# Patient Record
Sex: Female | Born: 1960 | Race: White | Hispanic: No | Marital: Married | Smoking: Current every day smoker
Health system: Southern US, Community
[De-identification: ages and names within clinical notes are randomized; demographics above are authoritative.]

## PROBLEM LIST (undated history)

## (undated) DIAGNOSIS — T7840XA Allergy, unspecified, initial encounter: Secondary | ICD-10-CM

## (undated) HISTORY — DX: Allergy, unspecified, initial encounter: T78.40XA

## (undated) HISTORY — PX: OTHER SURGICAL HISTORY: SHX169

---

## 2005-02-20 ENCOUNTER — Inpatient Hospital Stay: Payer: Self-pay | Admitting: Specialist

## 2006-11-17 ENCOUNTER — Ambulatory Visit: Payer: Self-pay | Admitting: Obstetrics and Gynecology

## 2018-04-26 ENCOUNTER — Other Ambulatory Visit: Payer: Self-pay

## 2018-04-26 ENCOUNTER — Encounter: Payer: Self-pay | Admitting: Nurse Practitioner

## 2018-04-26 ENCOUNTER — Ambulatory Visit: Payer: 59 | Admitting: Nurse Practitioner

## 2018-04-26 VITALS — BP 140/77 | HR 83 | Temp 98.3°F | Resp 16 | Ht 61.0 in | Wt 127.6 lb

## 2018-04-26 DIAGNOSIS — Z7689 Persons encountering health services in other specified circumstances: Secondary | ICD-10-CM | POA: Diagnosis not present

## 2018-04-26 DIAGNOSIS — B373 Candidiasis of vulva and vagina: Secondary | ICD-10-CM

## 2018-04-26 DIAGNOSIS — R300 Dysuria: Secondary | ICD-10-CM | POA: Diagnosis not present

## 2018-04-26 DIAGNOSIS — B3731 Acute candidiasis of vulva and vagina: Secondary | ICD-10-CM

## 2018-04-26 DIAGNOSIS — N3001 Acute cystitis with hematuria: Secondary | ICD-10-CM | POA: Diagnosis not present

## 2018-04-26 LAB — POCT URINALYSIS DIPSTICK
Bilirubin, UA: NEGATIVE
Glucose, UA: NEGATIVE
Ketones, UA: NEGATIVE
Nitrite, UA: NEGATIVE
Protein, UA: NEGATIVE
Spec Grav, UA: 1.01 (ref 1.010–1.025)
Urobilinogen, UA: 0.2 E.U./dL
pH, UA: 5 (ref 5.0–8.0)

## 2018-04-26 MED ORDER — CEPHALEXIN 500 MG PO CAPS: 500.0000 mg | ORAL_CAPSULE | Freq: Three times a day (TID) | ORAL | 0 refills | Status: AC

## 2018-04-26 MED ORDER — FLUCONAZOLE 150 MG PO TABS: 150.0000 mg | ORAL_TABLET | Freq: Once | ORAL | 0 refills | Status: AC

## 2018-04-26 NOTE — Patient Instructions (Addendum)
Lauren Haynes,   Thank you for coming in to clinic today.  1. For seasonal allergies - during allergy season take Claritin or generic loratadine 10 mg one tablet daily. - Can add flonase daily for at least 2 weeks if nasal congestion/sinus pressure worsen.  2.  Recommend starting with a full pack of cigarettes every morning.  Then cut back by 1-2 cigarettes per week/month until you have reduced your smoking.  - Let me know if you are ready to fully quit and want medications or nicotine replacement to help quit.  3. You have a UTI - START Keflex 500mg  3 times daily for next 5 days.   - Sent also diflucan if you get a yeast infection.  Take 1 tablet once. - Make sure you are urinating at least once every 3-4 hours.  Double void to completely empty your bladder if it has been longer than 3-4 hours.  Please schedule a follow-up appointment with Wilhelmina Mcardle, AGNP. Return in about 2 weeks (around 05/10/2018) for annual physical.    If you have any other questions or concerns, please feel free to call the clinic or send a message through MyChart. You may also schedule an earlier appointment if necessary.  You will receive a survey after today's visit either digitally by e-mail or paper by [employer]. Your experiences and feedback matter to [place].  Please respond so we know how we are doing as we provide care for you.   Wilhelmina Mcardle, DNP, AGNP-BC Adult Gerontology Nurse Practitioner [facility], [facility]

## 2018-04-26 NOTE — Progress Notes (Signed)
Subjective:    Patient ID: Lauren Haynes, female    DOB: [birth year], 58 y.o.   MRN: [id number]  Lauren LessenSandra L Jeanbaptiste is a 58 y.o. female presenting on 04/26/2018 for Annual Exam (Establish care) and Urinary Tract Infection (past week , burning, urge to urinate and nothing)   HPI Establish Care New Provider Pt last seen by PCP Many years ago.  [facility]  UTI symptoms   Patient feels she has had symptoms for over 1 week. Symptoms include pelvic pressure, dysuria, urinary urgency, inability to void with urge (not for prolonged periods of time).   - Started taking cranberry juice, taking Azo.  Has pelvic/bladder pain.  Mild improvement with OTC remedies, but pain persists.    - Has had UTI in past, but none in several years.  Patient notes in past she has had yeast infection after antibiotics treatment for UTI. - Patient has not had STD testing since separation/divorce from her husband.  Has occasional rash on buttock. Skin gets really sore/sensitive.  Opens into small blisters.  Do with Physical.  Past Medical History:  Diagnosis Date  . Allergy    Past Surgical History:  Procedure Laterality Date  . left leg broke Left    Social History   Socioeconomic History  . Marital status: Married    Spouse name: Not on file  . Number of children: Not on file  . Years of education: Not on file  . Highest education level: High school graduate  Occupational History  . Not on file  Social Needs  . Financial resource strain: Not hard at all  . Food insecurity:    Worry: Never true    Inability: Never true  . Transportation needs:    Medical: No    Non-medical: No  Tobacco Use  . Smoking status: Current Every Day Smoker    Packs/day: 1.00    Years: 15.00    Pack years: 15.00  . Smokeless tobacco: Never Used  . Tobacco comment: discussed reducing cigarettes available (start w new pack daily, reduce and continue to budget  cigarettes)  Substance and Sexual Activity  . Alcohol use: Yes    Comment: 0-1 per week  . Drug use: Not Currently    Comment: past 30 years ago, use limited to social only (no IVDU)  . Sexual activity: Not Currently    Birth control/protection: None  Lifestyle  . Physical activity:    Days per week: 0 days    Minutes per session: 0 min  . Stress: Not on file  Relationships  . Social connections:    Talks on phone: Not on file    Gets together: Not on file    Attends religious service: Not on file    Active member of club or organization: Not on file    Attends meetings of clubs or organizations: Not on file    Relationship status: Not on file  . Intimate partner violence:    Fear of current or ex partner: No    Emotionally abused: No    Physically abused: No    Forced sexual activity: No  Other Topics Concern  . Not on file  Social History Narrative  . Not on file   Family History  Problem Relation Age of Onset  . COPD Mother   . Heart disease Father   . Heart attack Maternal Grandfather   . Heart attack Paternal Grandfather    Current  Outpatient Medications on File Prior to Visit  Medication Sig  . acetaminophen (TYLENOL) 325 MG tablet Take 650 mg by mouth every 6 (six) hours as needed.  . Cranberry-Vitamin C-Probiotic (AZO CRANBERRY) 250-30 MG TABS Take by mouth.  Marland Kitchen ibuprofen (ADVIL,MOTRIN) 200 MG tablet Take 200 mg by mouth every 6 (six) hours as needed.   No current facility-administered medications on file prior to visit.     Review of Systems  Constitutional: Negative for activity change, appetite change and fatigue.  HENT: Negative for congestion.   Eyes: Negative for visual disturbance.  Respiratory: Negative for cough and shortness of breath.   Cardiovascular: Negative for chest pain, palpitations and leg swelling.  Gastrointestinal: Negative for constipation, diarrhea, nausea and vomiting.  Endocrine: Negative for cold intolerance and heat intolerance.   Genitourinary: Positive for difficulty urinating, dysuria, frequency, pelvic pain and urgency. Negative for dyspareunia, hematuria, vaginal bleeding, vaginal discharge and vaginal pain.  Musculoskeletal: Negative for arthralgias and myalgias.  Skin: Negative for rash.  Allergic/Immunologic: Negative for food allergies.  Neurological: Negative for dizziness and headaches.  Psychiatric/Behavioral: Negative for dysphoric mood, sleep disturbance and suicidal ideas. The patient is not nervous/anxious.    Per HPI unless specifically indicated above     Objective:    BP 140/77   Pulse 83   Temp 98.3 F (36.8 C) (Oral)   Resp 16   Ht 5\' 1"  (1.549 m)   Wt 127 lb 9.6 oz (57.9 kg)   SpO2 100%   BMI 24.11 kg/m   Wt Readings from Last 3 Encounters:  04/26/18 127 lb 9.6 oz (57.9 kg)    Physical Exam Vitals signs reviewed.  Constitutional:      General: She is not in acute distress.    Appearance: Normal appearance. She is well-developed.  HENT:     Head: Normocephalic and atraumatic.  Cardiovascular:     Rate and Rhythm: Normal rate and regular rhythm.     Pulses:          Radial pulses are 2+ on the right side and 2+ on the left side.       Posterior tibial pulses are 1+ on the right side and 1+ on the left side.     Heart sounds: Normal heart sounds, S1 normal and S2 normal.  Pulmonary:     Effort: Pulmonary effort is normal. No respiratory distress.     Breath sounds: Normal breath sounds and air entry.  Abdominal:     General: Bowel sounds are normal. There is no distension.     Palpations: Abdomen is soft. There is no mass.     Tenderness: There is abdominal tenderness in the suprapubic area. There is left CVA tenderness. There is no right CVA tenderness, guarding or rebound.     Hernia: No hernia is present.  Musculoskeletal:     Right lower leg: No edema.     Left lower leg: No edema.  Skin:    General: Skin is warm and dry.     Capillary Refill: Capillary refill takes  less than 2 seconds.  Neurological:     Mental Status: She is alert and oriented to person, place, and time.  Psychiatric:        Attention and Perception: Attention normal.        Mood and Affect: Mood and affect normal.        Behavior: Behavior normal. Behavior is cooperative.        Thought Content: Thought content normal.  Judgment: Judgment normal.      Results for orders placed or performed in visit on 04/26/18  POCT Urinalysis Dipstick  Result Value Ref Range   Color, UA yellow    Clarity, UA clear    Glucose, UA Negative Negative   Bilirubin, UA negative    Ketones, UA negative    Spec Grav, UA 1.010 1.010 - 1.025   Blood, UA LARGE    pH, UA 5.0 5.0 - 8.0   Protein, UA Negative Negative   Urobilinogen, UA 0.2 0.2 or 1.0 E.U./dL   Nitrite, UA negative    Leukocytes, UA Large (3+) (A) Negative   Appearance     Odor        Assessment & Plan:   Problem List Items Addressed This Visit    None    Visit Diagnoses    Dysuria    -  Primary   Relevant Orders   POCT Urinalysis Dipstick (Completed)   Encounter to establish care       Acute cystitis with hematuria       Vaginal candidiasis          # Previous PCP was many years ago.  Recent care is acute episodic care.  Records will not be requested.  Past medical, family, and surgical history reviewed w/ pt.  # Acute cystitis with hematuria.  Pt symptomatic currently with increased suprapubic pressure x 7+ days. Currently without systemic signs or symptoms of infection.   - No current risk of concurrent STI.  Plan: 1. START Keflex 500mg  3 times daily for next 5 days.   - Patient often with vaginal candidiasis after UTI treatment.  Sent diflucan for treatment after antibiotics if needed. 2. Provided non-pharm measures for UTI prevention for good hygiene. 3. Drink plenty of fluids and improve hydration over next 1 week. 4. Provided precautions for severe symptoms requiring ED visit to include no urine in 24-48  hours. 5. Followup 2-5 days as needed for worsening or persistent symptoms.     Meds ordered this encounter  Medications  . cephALEXin (KEFLEX) 500 MG capsule    Sig: Take 1 capsule (500 mg total) by mouth 3 (three) times daily for 5 days.    Dispense:  15 capsule    Refill:  0    Order Specific Question:   Supervising Provider    Answer:   Smitty Cords [[id number]]  . fluconazole (DIFLUCAN) 150 MG tablet    Sig: Take 1 tablet (150 mg total) by mouth once for 1 dose.    Dispense:  1 tablet    Refill:  0    Order Specific Question:   Supervising Provider    Answer:   Smitty Cords [[id number]]     Follow up plan: Return in about 2 weeks (around 05/10/2018) for annual physical.  Wilhelmina Mcardle, DNP, AGPCNP-BC Adult Gerontology Primary Care Nurse Practitioner [facility] 04/26/2018, 10:58 AM

## 2018-04-27 ENCOUNTER — Encounter: Payer: Self-pay | Admitting: Nurse Practitioner

## 2018-05-03 ENCOUNTER — Encounter: Payer: Self-pay | Admitting: Nurse Practitioner

## 2018-05-10 ENCOUNTER — Encounter: Payer: Self-pay | Admitting: Nurse Practitioner

## 2018-05-10 ENCOUNTER — Other Ambulatory Visit: Payer: Self-pay

## 2018-05-10 ENCOUNTER — Ambulatory Visit (INDEPENDENT_AMBULATORY_CARE_PROVIDER_SITE_OTHER): Payer: 59 | Admitting: Nurse Practitioner

## 2018-05-10 ENCOUNTER — Other Ambulatory Visit (HOSPITAL_COMMUNITY)
Admission: RE | Admit: 2018-05-10 | Discharge: 2018-05-10 | Disposition: A | Discharge status: Home / Self Care | Payer: 59 | Source: Ambulatory Visit | Attending: Nurse Practitioner | Admitting: Nurse Practitioner

## 2018-05-10 VITALS — BP 124/70 | HR 78 | Temp 98.5°F | Ht 61.0 in | Wt 127.6 lb

## 2018-05-10 DIAGNOSIS — Z124 Encounter for screening for malignant neoplasm of cervix: Secondary | ICD-10-CM | POA: Diagnosis present

## 2018-05-10 DIAGNOSIS — Z1239 Encounter for other screening for malignant neoplasm of breast: Secondary | ICD-10-CM

## 2018-05-10 DIAGNOSIS — Z1211 Encounter for screening for malignant neoplasm of colon: Secondary | ICD-10-CM | POA: Diagnosis not present

## 2018-05-10 DIAGNOSIS — Z13820 Encounter for screening for osteoporosis: Secondary | ICD-10-CM | POA: Diagnosis not present

## 2018-05-10 DIAGNOSIS — Z23 Encounter for immunization: Secondary | ICD-10-CM | POA: Diagnosis not present

## 2018-05-10 DIAGNOSIS — E782 Mixed hyperlipidemia: Secondary | ICD-10-CM

## 2018-05-10 DIAGNOSIS — Z0000 Encounter for general adult medical examination without abnormal findings: Secondary | ICD-10-CM | POA: Diagnosis not present

## 2018-05-10 NOTE — Progress Notes (Signed)
Subjective:    Patient ID: Lauren Haynes, female    DOB: 10/19/1960, 58 y.o.   MRN: [id number]  Lauren Haynes is a 58 y.o. female presenting on 05/10/2018 for Annual Exam  HPI Annual Physical Exam Patient has been feeling well.  UTI is resolved.  They have no acute concerns today. Sleeps 8 hours per night uninterrupted.  HEALTH MAINTENANCE: Weight/BMI: stable - per patient at home and in clinic Physical activity: regular Diet: meals out/fried foods regularly.  Limits portions.  10 times per week meals out.  - Roast beef sandwich/cheddar fries - eats only 1/2.   Seatbelt: always Sunscreen: not regularly other than face, if prolonged periods in summer PAP: does not remember last -  Mammogram: no recent screening - has had past cysts DEXA: Has had one more than 6 years ago Colon Cancer Screen: cologuard HIV/HEP C: Recommended and patient consents Optometry: not regularly Dentistry: Regularly  VACCINES: Tetanus: due - receive today Influenza: declines  Past Medical History:  Diagnosis Date  . Allergy    Past Surgical History:  Procedure Laterality Date  . left leg broke Left    Social History   Socioeconomic History  . Marital status: Married    Spouse name: Not on file  . Number of children: Not on file  . Years of education: Not on file  . Highest education level: High school graduate  Occupational History  . Not on file  Social Needs  . Financial resource strain: Not hard at all  . Food insecurity:    Worry: Never true    Inability: Never true  . Transportation needs:    Medical: No    Non-medical: No  Tobacco Use  . Smoking status: Current Every Day Smoker    Packs/day: 1.00    Years: 15.00    Pack years: 15.00  . Smokeless tobacco: Never Used  . Tobacco comment: discussed reducing cigarettes available (start w new pack daily, reduce and continue to budget cigarettes)  Substance and Sexual Activity  . Alcohol use: Yes    Comment: 0-1 per week   . Drug use: Not Currently    Comment: past 30 years ago, use limited to social only (no IVDU)  . Sexual activity: Not Currently    Birth control/protection: None  Lifestyle  . Physical activity:    Days per week: 0 days    Minutes per session: 0 min  . Stress: Not on file  Relationships  . Social connections:    Talks on phone: Not on file    Gets together: Not on file    Attends religious service: Not on file    Active member of club or organization: Not on file    Attends meetings of clubs or organizations: Not on file    Relationship status: Not on file  . Intimate partner violence:    Fear of current or ex partner: No    Emotionally abused: No    Physically abused: No    Forced sexual activity: No  Other Topics Concern  . Not on file  Social History Narrative  . Not on file   Family History  Problem Relation Age of Onset  . COPD Mother   . Heart disease Father   . Heart attack Maternal Grandfather   . Heart attack Paternal Grandfather    No current outpatient medications on file prior to visit.   No current facility-administered medications on file prior to visit.     Review  of Systems  Constitutional: Negative for chills and fever.  HENT: Negative for congestion and sore throat.   Eyes: Negative for pain.  Respiratory: Negative for cough, shortness of breath and wheezing.   Cardiovascular: Negative for chest pain, palpitations and leg swelling.  Gastrointestinal: Negative for abdominal pain, blood in stool, constipation, diarrhea, nausea and vomiting.  Endocrine: Negative for polydipsia.  Genitourinary: Positive for dyspareunia. Negative for dysuria, frequency, hematuria and urgency.  Musculoskeletal: Negative for back pain, myalgias and neck pain.  Skin: Negative.  Negative for rash.  Allergic/Immunologic: Negative for environmental allergies.  Neurological: Negative for dizziness, weakness and headaches.  Hematological: Does not bruise/bleed easily.   Psychiatric/Behavioral: Negative for dysphoric mood and suicidal ideas. The patient is not nervous/anxious.    Per HPI unless specifically indicated above     Objective:    BP 124/70 (BP Location: Right Arm, Patient Position: Sitting, Cuff Size: Normal)   Pulse 78   Temp 98.5 F (36.9 C) (Oral)   Ht 5\' 1"  (1.549 m)   Wt 127 lb 9.6 oz (57.9 kg)   BMI 24.11 kg/m   Wt Readings from Last 3 Encounters:  05/10/18 127 lb 9.6 oz (57.9 kg)  04/26/18 127 lb 9.6 oz (57.9 kg)    Physical Exam Vitals signs and nursing note reviewed.  Constitutional:      General: She is not in acute distress.    Appearance: She is well-developed.  HENT:     Head: Normocephalic and atraumatic.     Right Ear: External ear normal.     Left Ear: External ear normal.     Nose: Nose normal.  Eyes:     Conjunctiva/sclera: Conjunctivae normal.     Pupils: Pupils are equal, round, and reactive to light.  Neck:     Musculoskeletal: Normal range of motion and neck supple.     Thyroid: No thyromegaly.     Vascular: No JVD.     Trachea: No tracheal deviation.  Cardiovascular:     Rate and Rhythm: Normal rate and regular rhythm.     Heart sounds: Normal heart sounds. No murmur. No friction rub. No gallop.   Pulmonary:     Effort: Pulmonary effort is normal. No respiratory distress.     Breath sounds: Normal breath sounds.  Chest:     Comments: Breast - Normal exam w/ symmetric breasts, no mass, no nipple discharge, no skin changes or tenderness. Abdominal:     General: Bowel sounds are normal. There is no distension.     Palpations: Abdomen is soft.     Tenderness: There is no abdominal tenderness.  Genitourinary:    Comments: Normal external female genitalia without lesions or fusion. Vaginal canal without lesions. Normal appearing cervix without lesions or friability. Physiologic discharge on exam. Bimanual exam without adnexal masses, enlarged uterus, or cervical motion tenderness. Musculoskeletal: Normal  range of motion.  Lymphadenopathy:     Cervical: No cervical adenopathy.  Skin:    General: Skin is warm and dry.  Neurological:     Mental Status: She is alert and oriented to person, place, and time.     Cranial Nerves: No cranial nerve deficit.  Psychiatric:        Behavior: Behavior normal.        Thought Content: Thought content normal.        Judgment: Judgment normal.    Results for orders placed or performed in visit on 04/26/18  POCT Urinalysis Dipstick  Result Value Ref Range  Color, UA yellow    Clarity, UA clear    Glucose, UA Negative Negative   Bilirubin, UA negative    Ketones, UA negative    Spec Grav, UA 1.010 1.010 - 1.025   Blood, UA LARGE    pH, UA 5.0 5.0 - 8.0   Protein, UA Negative Negative   Urobilinogen, UA 0.2 0.2 or 1.0 E.U./dL   Nitrite, UA negative    Leukocytes, UA Large (3+) (A) Negative   Appearance     Odor        Assessment & Plan:   Problem List Items Addressed This Visit    None    Visit Diagnoses    Encounter for annual physical exam    -  Primary   Relevant Orders   MM DIGITAL SCREENING BILATERAL   DG Bone Density   Cologuard   Tdap vaccine greater than or equal to 7yo IM (Completed)   TSH (Completed)   Lipid panel (Completed)   Hemoglobin A1c (Completed)   Hepatitis C antibody (Completed)   HIV Antibody (routine testing w rflx) (Completed)   COMPLETE METABOLIC PANEL WITH GFR (Completed)   CBC with Differential/Platelet (Completed)   Breast cancer screening       Relevant Orders   MM DIGITAL SCREENING BILATERAL   Osteoporosis screening       Relevant Orders   DG Bone Density   Colon cancer screening       Relevant Orders   Cologuard   Need for diphtheria-tetanus-pertussis (Tdap) vaccine       Relevant Orders   Tdap vaccine greater than or equal to 7yo IM (Completed)   Cervical cancer screening       Relevant Orders   Cytology - PAP (Completed)   Mixed hyperlipidemia       Relevant Medications   rosuvastatin  (CRESTOR) 10 MG tablet    Annual physical exam with no new findings.  Well adult with no acute concerns.  Plan: 1. Obtain health maintenance screenings as above according to age. - Increase physical activity to 30 minutes most days of the week.  - Eat healthy diet high in vegetables and fruits; low in refined carbohydrates. - Screening labs and tests as ordered - Administer vaccines as requested. 2. Return 1 year for annual physical.     Follow up plan: Return in about 1 year (around 05/10/2019) for annual physical.  Wilhelmina Mcardle, DNP, AGPCNP-BC Adult Gerontology Primary Care Nurse Practitioner [facility] 05/10/2018, 8:33 AM

## 2018-05-10 NOTE — Patient Instructions (Addendum)
Lauren Haynes,   Thank you for coming in to clinic today.  1. Work to [employer].  Reduce fried foods.  Add more vegetables (eat colorful foods!).    2. Increase your physical activity until you are increasing your heart rate for 30 minutes on most days of the week.  3. Your mammogram and bone density orders have been placed.  Call the Scheduling phone number at [phone] to schedule your mammogram at your convenience.  You can choose to go to either location listed below.  Let the scheduler know which location you prefer.  [place]  [place], [place] [address]   [facility] [address] [place], [place] [address]   4. Colon Cancer Screening: - For all adults age 30 and older, routine colon cancer screening is highly recommended. - Early detection of colon cancer is important, because often there are no warning signs or symptoms.  If colon cancer is found early, usually it can be cured. Advanced cancer is hard to treat.  - If you are not interested in Colonoscopy screening (if done and normal you could be cleared for 5 to 10 years until next due), then Cologuard is an excellent alternative for screening test for Colon Cancer. It is highly sensitive for detecting DNA of colon cancer from even the earliest stages. Also, there is NO bowel prep required. - If Cologuard is NEGATIVE, then it is good for 3 years before next due - If Cologuard is POSITIVE, then it is strongly advised to get a Colonoscopy, which allows the GI doctor to locate the source of the cancer or polyp (even very early stage) and treat it by removing it. ------------------------- If you would like to proceed with Cologuard (stool DNA test) - FIRST, call your insurance company and tell them you want to check cost of Cologuard tell them CPT Code [phone] (it may be completely covered with a small or no  cost, OR max cost without any coverage is about $600). If you do NOT open the kit, and decide not to do the test, you will NOT be charged, you should contact the company to return the kit if you decide not to do the test. - If you want to proceed, you can notify us (office phone, [place], or at next visit) and we will order it for you. The test kit will be delivered to your house in about 1 week. Follow instructions to collect your stool sample.  You may call the company for any help or questions, 24/7 telephone support at [phone].   Please schedule a follow-up appointment with Cassell Smiles, AGNP. Return in about 1 year (around 05/10/2019) for annual physical.  If you have any other questions or concerns, please feel free to call the clinic or send a message through [place]. You may also schedule an earlier appointment if necessary.  You will receive a survey after today's visit either digitally by e-mail or paper by [employer]. Your experiences and feedback matter to [place].  Please respond so we know how we are doing as we provide care for you.   Cassell Smiles, DNP, AGNP-BC Adult Gerontology Nurse Practitioner [place]

## 2018-05-11 LAB — COMPLETE METABOLIC PANEL WITH GFR
AG Ratio: 1.7 (calc) (ref 1.0–2.5)
ALT: 15 U/L (ref 6–29)
AST: 18 U/L (ref 10–35)
Albumin: 4.5 g/dL (ref 3.6–5.1)
Alkaline phosphatase (APISO): 71 U/L (ref 37–153)
BUN: 10 mg/dL (ref 7–25)
CO2: 26 mmol/L (ref 20–32)
Calcium: 9.8 mg/dL (ref 8.6–10.4)
Chloride: 104 mmol/L (ref 98–110)
Creat: 0.7 mg/dL (ref 0.50–1.05)
GFR, Est African American: 111 mL/min/{1.73_m2} (ref >=60)
GFR, Est Non African American: 96 mL/min/{1.73_m2} (ref >=60)
Globulin: 2.6 g/dL (calc) (ref 1.9–3.7)
Glucose, Bld: 89 mg/dL (ref 65–99)
Potassium: 4.7 mmol/L (ref 3.5–5.3)
Sodium: 139 mmol/L (ref 135–146)
Total Bilirubin: 0.5 mg/dL (ref 0.2–1.2)
Total Protein: 7.1 g/dL (ref 6.1–8.1)

## 2018-05-11 LAB — CBC WITH DIFFERENTIAL/PLATELET
Absolute Monocytes: 640 cells/uL (ref 200–950)
Basophils Absolute: 62 cells/uL (ref 0–200)
Basophils Relative: 0.8 %
Eosinophils Absolute: 47 cells/uL (ref 15–500)
Eosinophils Relative: 0.6 %
HCT: 42 % (ref 35.0–45.0)
Hemoglobin: 14.5 g/dL (ref 11.7–15.5)
Lymphs Abs: 2824 cells/uL (ref 850–3900)
MCH: 30.7 pg (ref 27.0–33.0)
MCHC: 34.5 g/dL (ref 32.0–36.0)
MCV: 88.8 fL (ref 80.0–100.0)
MPV: 11.1 fL (ref 7.5–12.5)
Monocytes Relative: 8.2 %
Neutro Abs: 4228 cells/uL (ref 1500–7800)
Neutrophils Relative %: 54.2 %
Platelets: 308 10*3/uL (ref 140–400)
RBC: 4.73 10*6/uL (ref 3.80–5.10)
RDW: 12.1 % (ref 11.0–15.0)
Total Lymphocyte: 36.2 %
WBC: 7.8 10*3/uL (ref 3.8–10.8)

## 2018-05-11 LAB — LIPID PANEL
Cholesterol: 276 mg/dL — ABNORMAL HIGH (ref <200)
HDL: 44 mg/dL — ABNORMAL LOW (ref >=50)
LDL Cholesterol (Calc): 200 mg/dL (calc) — ABNORMAL HIGH
Non-HDL Cholesterol (Calc): 232 mg/dL (calc) — ABNORMAL HIGH (ref <130)
Total CHOL/HDL Ratio: 6.3 (calc) — ABNORMAL HIGH (ref <5.0)
Triglycerides: 154 mg/dL — ABNORMAL HIGH (ref <150)

## 2018-05-11 LAB — HEMOGLOBIN A1C
Hgb A1c MFr Bld: 5.7 % of total Hgb — ABNORMAL HIGH (ref <5.7)
Mean Plasma Glucose: 117 (calc)
eAG (mmol/L): 6.5 (calc)

## 2018-05-11 LAB — CYTOLOGY - PAP
Diagnosis: NEGATIVE
HPV: NOT DETECTED

## 2018-05-11 LAB — HIV ANTIBODY (ROUTINE TESTING W REFLEX): HIV 1&2 Ab, 4th Generation: NONREACTIVE

## 2018-05-11 LAB — HEPATITIS C ANTIBODY
Hepatitis C Ab: NONREACTIVE
SIGNAL TO CUT-OFF: 0.01 (ref <1.00)

## 2018-05-11 LAB — TSH: TSH: 1.32 mIU/L (ref 0.40–4.50)

## 2018-05-14 MED ORDER — ROSUVASTATIN CALCIUM 10 MG PO TABS: 10.0000 mg | ORAL_TABLET | Freq: Every day | ORAL | 3 refills | Status: DC

## 2018-05-17 ENCOUNTER — Encounter: Payer: Self-pay | Admitting: Nurse Practitioner

## 2018-05-17 DIAGNOSIS — N3001 Acute cystitis with hematuria: Secondary | ICD-10-CM

## 2018-05-17 MED ORDER — CEPHALEXIN 500 MG PO CAPS: 500.0000 mg | ORAL_CAPSULE | Freq: Three times a day (TID) | ORAL | 0 refills | Status: AC

## 2018-06-12 LAB — COLOGUARD: Cologuard: POSITIVE — AB

## 2018-06-15 ENCOUNTER — Encounter: Payer: Self-pay | Admitting: Family Medicine

## 2018-06-15 ENCOUNTER — Other Ambulatory Visit: Payer: Self-pay

## 2021-06-17 ENCOUNTER — Ambulatory Visit (INDEPENDENT_AMBULATORY_CARE_PROVIDER_SITE_OTHER): Payer: 59

## 2021-06-17 ENCOUNTER — Ambulatory Visit
Admission: RE | Admit: 2021-06-17 | Discharge: 2021-06-17 | Disposition: A | Discharge status: Home / Self Care | Payer: 59 | Source: Ambulatory Visit

## 2021-06-17 VITALS — BP 132/86 | HR 92 | Temp 98.1°F | Resp 18 | Ht 61.0 in | Wt 127.6 lb

## 2021-06-17 DIAGNOSIS — R519 Headache, unspecified: Secondary | ICD-10-CM

## 2021-06-17 DIAGNOSIS — R059 Cough, unspecified: Secondary | ICD-10-CM

## 2021-06-17 DIAGNOSIS — J45909 Unspecified asthma, uncomplicated: Secondary | ICD-10-CM | POA: Diagnosis not present

## 2021-06-17 DIAGNOSIS — J301 Allergic rhinitis due to pollen: Secondary | ICD-10-CM | POA: Diagnosis not present

## 2021-06-17 DIAGNOSIS — F17200 Nicotine dependence, unspecified, uncomplicated: Secondary | ICD-10-CM

## 2021-06-17 MED ORDER — HYDROCODONE BIT-HOMATROP MBR 5-1.5 MG/5ML PO SOLN: 5.0000 mL | Freq: Every evening | ORAL | 0 refills | Status: AC | PRN

## 2021-06-17 MED ORDER — ALBUTEROL SULFATE HFA 108 (90 BASE) MCG/ACT IN AERS: 2.0000 | INHALATION_SPRAY | RESPIRATORY_TRACT | 0 refills | Status: AC | PRN

## 2021-06-17 MED ORDER — PREDNISONE 20 MG PO TABS: 20.0000 mg | ORAL_TABLET | Freq: Every day | ORAL | 0 refills | Status: AC

## 2021-06-17 MED ORDER — FEXOFENADINE HCL 180 MG PO TABS: 180.0000 mg | ORAL_TABLET | Freq: Every day | ORAL | 0 refills | Status: AC

## 2021-06-17 NOTE — ED Triage Notes (Signed)
Patient Is here for "Cough". I have had it for "about 3 wks" Started with Tele-doc, given Z Pak and not improved. At night "cough and congestion is worse". No sob. Headache and pressure as well.  ?

## 2021-06-17 NOTE — ED Triage Notes (Signed)
Exposure to Grand-daughter who has strep (no st).  ?

## 2021-06-17 NOTE — ED Provider Notes (Signed)
?[facility] ? ? ? ?CSN: [id number] ?Arrival date & time: 06/17/21  1344 ? ? ?  ? ?History   ?Chief Complaint ?Chief Complaint  ?Patient presents with  ? Cough  ?  Entered by patient ?Appt  ? ? ?HPI ?Lauren Haynes is a 61 y.o. female who presents with cough x 3 weeks. Had a tele-doc visit  last week and was prescribed zpack for sinus infection, but has not helped. Her cough and rhinitis seems to get worse at night time. She has pressure on her forehead. Denies a fever. Admits she is a smoker.  ? ? ? ?Past Medical History:  ?Diagnosis Date  ? Allergy   ? ? ?There are no problems to display for this patient. ? ? ?Past Surgical History:  ?Procedure Laterality Date  ? left leg broke Left   ? ? ?OB History   ?No obstetric history on file. ?  ? ? ? ?Home Medications   ? ?Prior to Admission medications   ?Medication Sig Start Date End Date Taking? Authorizing Provider  ?albuterol (VENTOLIN HFA) 108 (90 Base) MCG/ACT inhaler Inhale 2 puffs into the lungs every 4 (four) hours as needed for wheezing or shortness of breath. 06/17/21  Yes Rodriguez-Southworth, Nettie Elm, PA-C  ?fexofenadine (ALLEGRA ALLERGY) 180 MG tablet Take 1 tablet (180 mg total) by mouth daily. 06/17/21  Yes Rodriguez-Southworth, Nettie Elm, PA-C  ?HYDROcodone bit-homatropine (HYCODAN) 5-1.5 MG/5ML syrup Take 5 mLs by mouth at bedtime as needed for cough. 06/17/21  Yes Rodriguez-Southworth, Nettie Elm, PA-C  ?predniSONE (DELTASONE) 20 MG tablet Take 1 tablet (20 mg total) by mouth daily with breakfast. 06/17/21  Yes Rodriguez-Southworth, Nettie Elm, PA-C  ? ? ?Family History ?Family History  ?Problem Relation Age of Onset  ? COPD Mother   ? Heart disease Father   ? Heart attack Maternal Grandfather   ? Heart attack Paternal Grandfather   ? ? ?Social History ?Social History  ? ?Tobacco Use  ? Smoking status: Every Day  ?  Packs/day: 1.00  ?  Years: 15.00  ?  Pack years: 15.00  ?  Types: Cigarettes  ? Smokeless tobacco: Never  ? Tobacco comments:  ?  discussed  reducing cigarettes available (start w new pack daily, reduce and continue to budget cigarettes)  ?Vaping Use  ? Vaping Use: Never used  ?Substance Use Topics  ? Alcohol use: Yes  ?  Comment: 0-1 per week  ? Drug use: Not Currently  ?  Comment: past 30 years ago, use limited to social only (no IVDU)  ? ? ? ?Allergies   ?Patient has no known allergies. ? ? ?Review of Systems ?Review of Systems ?+ sinus HA, pressure, cough, rhinitis. The rest is negative.  ? ?Physical Exam ?Triage Vital Signs ?ED Triage Vitals  ?Enc Vitals Group  ?   BP 06/17/21 1416 132/86  ?   Pulse Rate 06/17/21 1416 92  ?   Resp 06/17/21 1416 18  ?   Temp 06/17/21 1416 98.1 ?F (36.7 ?C)  ?   Temp Source 06/17/21 1416 Oral  ?   SpO2 06/17/21 1416 100 %  ?   Weight 06/17/21 1414 127 lb 10.3 oz (57.9 kg)  ?   Height 06/17/21 1414 5\' 1"  (1.549 m)  ?   Head Circumference --   ?   Peak Flow --   ?   Pain Score 06/17/21 1411 8  ?   Pain Loc --   ?   Pain Edu? --   ?  Excl. in GC? --   ? ?No data found. ? ?Updated Vital Signs ?BP 132/86 (BP Location: Left Arm)   Pulse 92   Temp 98.1 ?F (36.7 ?C) (Oral)   Resp 18   Ht 5\' 1"  (1.549 m)   Wt 127 lb 10.3 oz (57.9 kg)   SpO2 100%   BMI 24.12 kg/m?  ? ?Visual Acuity ?Right Eye Distance:   ?Left Eye Distance:   ?Bilateral Distance:   ? ?Right Eye Near:   ?Left Eye Near:    ?Bilateral Near:    ? ? ?Physical Exam ?Constitutional:   ?   General: He is not in acute distress. ?   Appearance: He is not toxic-appearing.  ?HENT:  ?   Head: Normocephalic.  ?   Right Ear: Tympanic membrane, ear canal and external ear normal.  ?   Left Ear: Ear canal and external ear normal.  ?   Nose: with pink pale mucosa and clear mucous. Has tender frontal sinuses.  ?   Mouth/Throat: clear ?   Mouth: Mucous membranes are moist.  ?   Pharynx: Oropharynx is clear.  ?Eyes:  ?   General: No scleral icterus. ?   Conjunctiva/sclera: Conjunctivae normal.  ?Cardiovascular:  ?   Rate and Rhythm: Normal rate and regular rhythm.  ?   Heart  sounds: No murmur heard.  ? ?Pulmonary:  ?   Effort: Pulmonary effort is normal. No respiratory distress.  ?   Breath sounds: Wheezing present thoughout, L>R.  ?    ?Musculoskeletal:     ?   General: Normal range of motion.  ?   Cervical back: Neck supple.  ?Lymphadenopathy:  ?   Cervical: No cervical adenopathy.  ?Skin: ?   General: Skin is warm and dry.  ?   Findings: No rash.  ?Neurological:  ?   Mental Status: He is alert and oriented to person, place, and time.  ?   Gait: Gait normal.  ?Psychiatric:     ?   Mood and Affect: Mood normal.     ?   Behavior: Behavior normal.     ?   Thought Content: Thought content normal.     ?   Judgment: Judgment normal.  ? ? ?UC Treatments / Results  ?Labs ?(all labs ordered are listed, but only abnormal results are displayed) ?Labs Reviewed - No data to display ? ?EKG ? ? ?Radiology ?DG Chest 2 View ? ?Result Date: 06/17/2021 ?CLINICAL DATA:  Cough x3 weeks EXAM: CHEST - 2 VIEW COMPARISON:  None. FINDINGS: Cardiac size is within normal limits. Lung fields are clear of any infiltrates or pulmonary edema. There is no pleural effusion or pneumothorax. Pleural thickening is seen in both apices without erosive changes in the adjacent ribs. IMPRESSION: No active cardiopulmonary disease. Electronically Signed   By: 06/19/2021 M.D.   On: 06/17/2021 14:35   ? ?Procedures ?Procedures (including critical care time) ? ?Medications Ordered in UC ?Medications - No data to display ? ?Initial Impression / Assessment and Plan / UC Course  ?I have reviewed the triage vital signs and the nursing notes. ? ?Pertinent  imaging results that were available during my care of the patient were reviewed by me and considered in my medical decision making (see chart for details). ? ?Has allergic rhinitis, allergic bronchitis, is chronic smoker, sinus HA ?I palced hr on Prednisone, Albuterol inahler, Allegra and Flonase as noted.  ?Advised to d/c smoking ? ?Final Clinical Impressions(s) / UC  Diagnoses  ? ?Final diagnoses:  ?Bronchitis, allergic, unspecified asthma severity, uncomplicated  ?Non-seasonal allergic rhinitis due to pollen  ?Smoker  ?Sinus headache  ? ? ? ?Discharge Instructions   ? ?  ?Use the Nose spray and inhaler for one week, then only as needed after that for cough, wheezing and post nasal drainage.  ? ? ? ? ?ED Prescriptions   ? ? Medication Sig Dispense Auth. Provider  ? fexofenadine (ALLEGRA ALLERGY) 180 MG tablet Take 1 tablet (180 mg total) by mouth daily. 30 tablet Rodriguez-Southworth, Nettie ElmSylvia, PA-C  ? predniSONE (DELTASONE) 20 MG tablet Take 1 tablet (20 mg total) by mouth daily with breakfast. 5 tablet Rodriguez-Southworth, Nettie ElmSylvia, PA-C  ? albuterol (VENTOLIN HFA) 108 (90 Base) MCG/ACT inhaler Inhale 2 puffs into the lungs every 4 (four) hours as needed for wheezing or shortness of breath. 18 g Rodriguez-Southworth, Timber Lucarelli, PA-C  ? HYDROcodone bit-homatropine (HYCODAN) 5-1.5 MG/5ML syrup Take 5 mLs by mouth at bedtime as needed for cough. 120 mL Rodriguez-Southworth, Nettie ElmSylvia, PA-C  ? ?  ? ?I have reviewed the PDMP during this encounter. ?  ?Garey HamRodriguez-Southworth, Derral Colucci, PA-C ?06/17/21 1452 ? ?

## 2021-06-17 NOTE — Discharge Instructions (Addendum)
Use the Nose spray and inhaler for one week, then only as needed after that for cough, wheezing and post nasal drainage.  ?Work on quit smoking ?

## 2022-11-19 IMAGING — CR DG CHEST 2V
3 series · 3 of 3 positions shown · non-contrast
Comparison: None.

CLINICAL DATA: Cough x3 weeks

EXAM:
CHEST - 2 VIEW

[chest pa (1 of 2)]
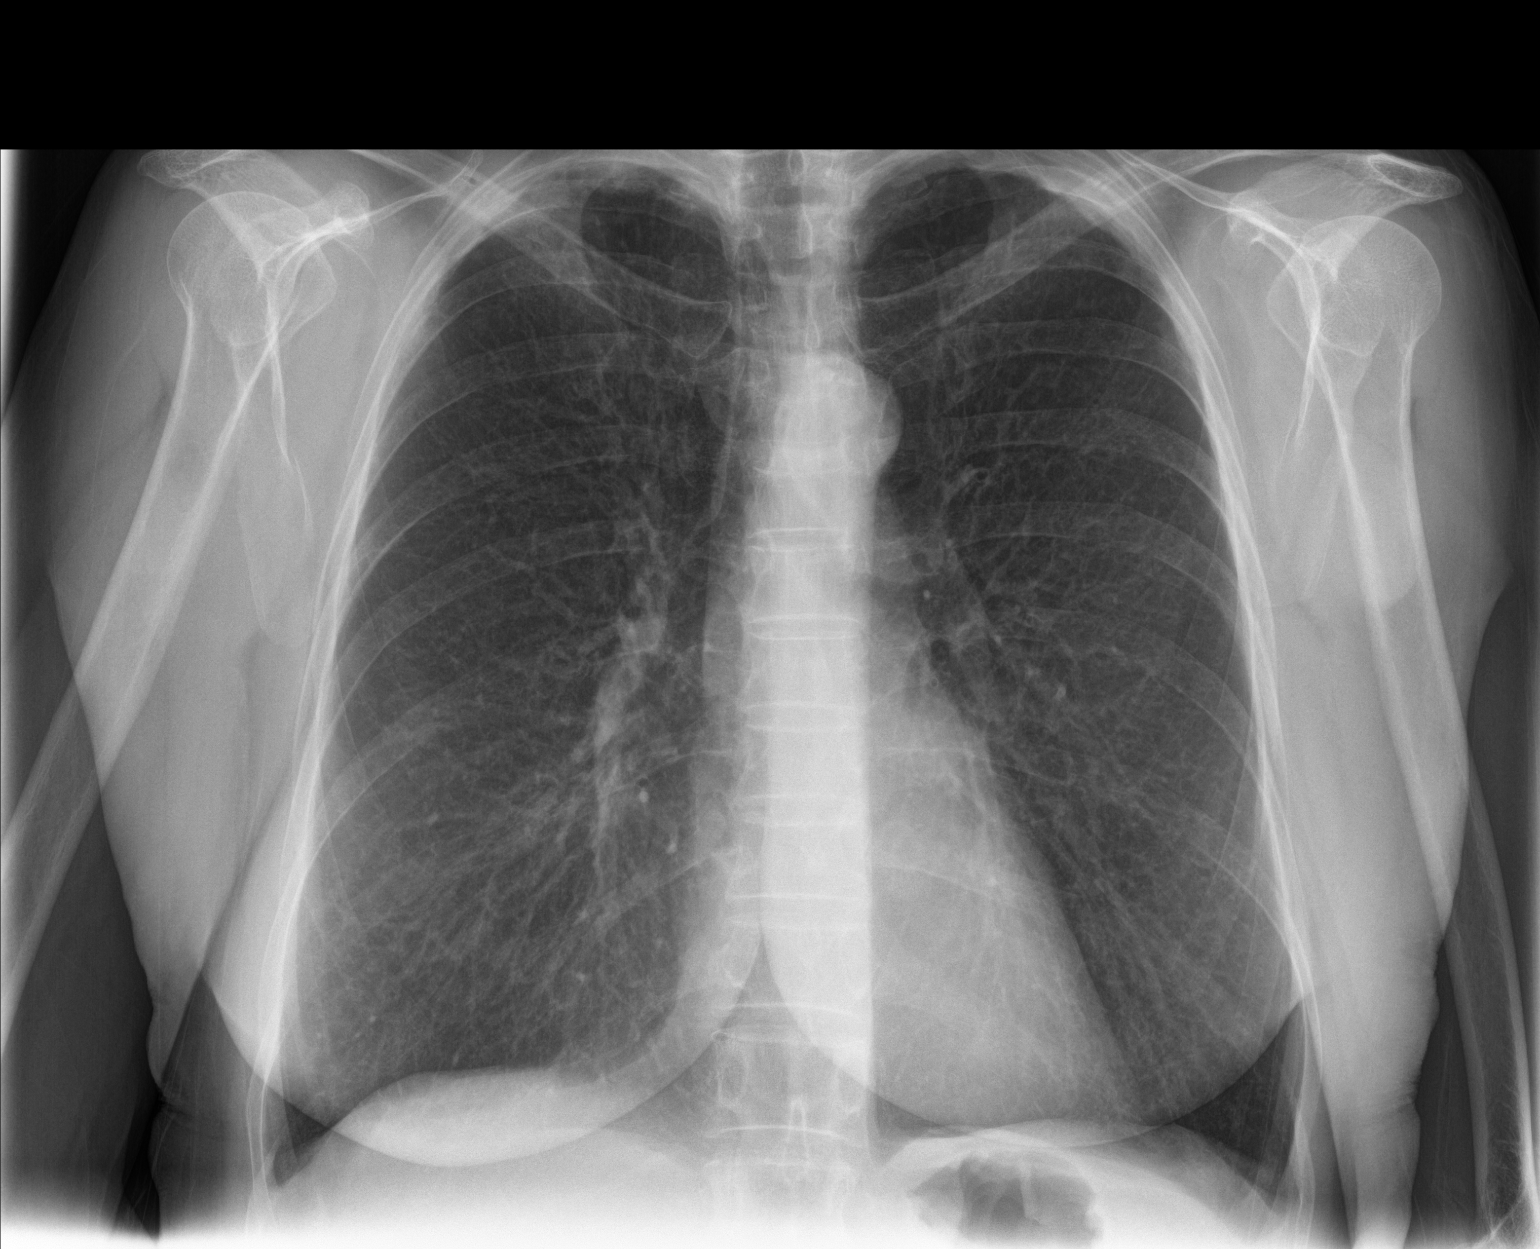

[chest lat]
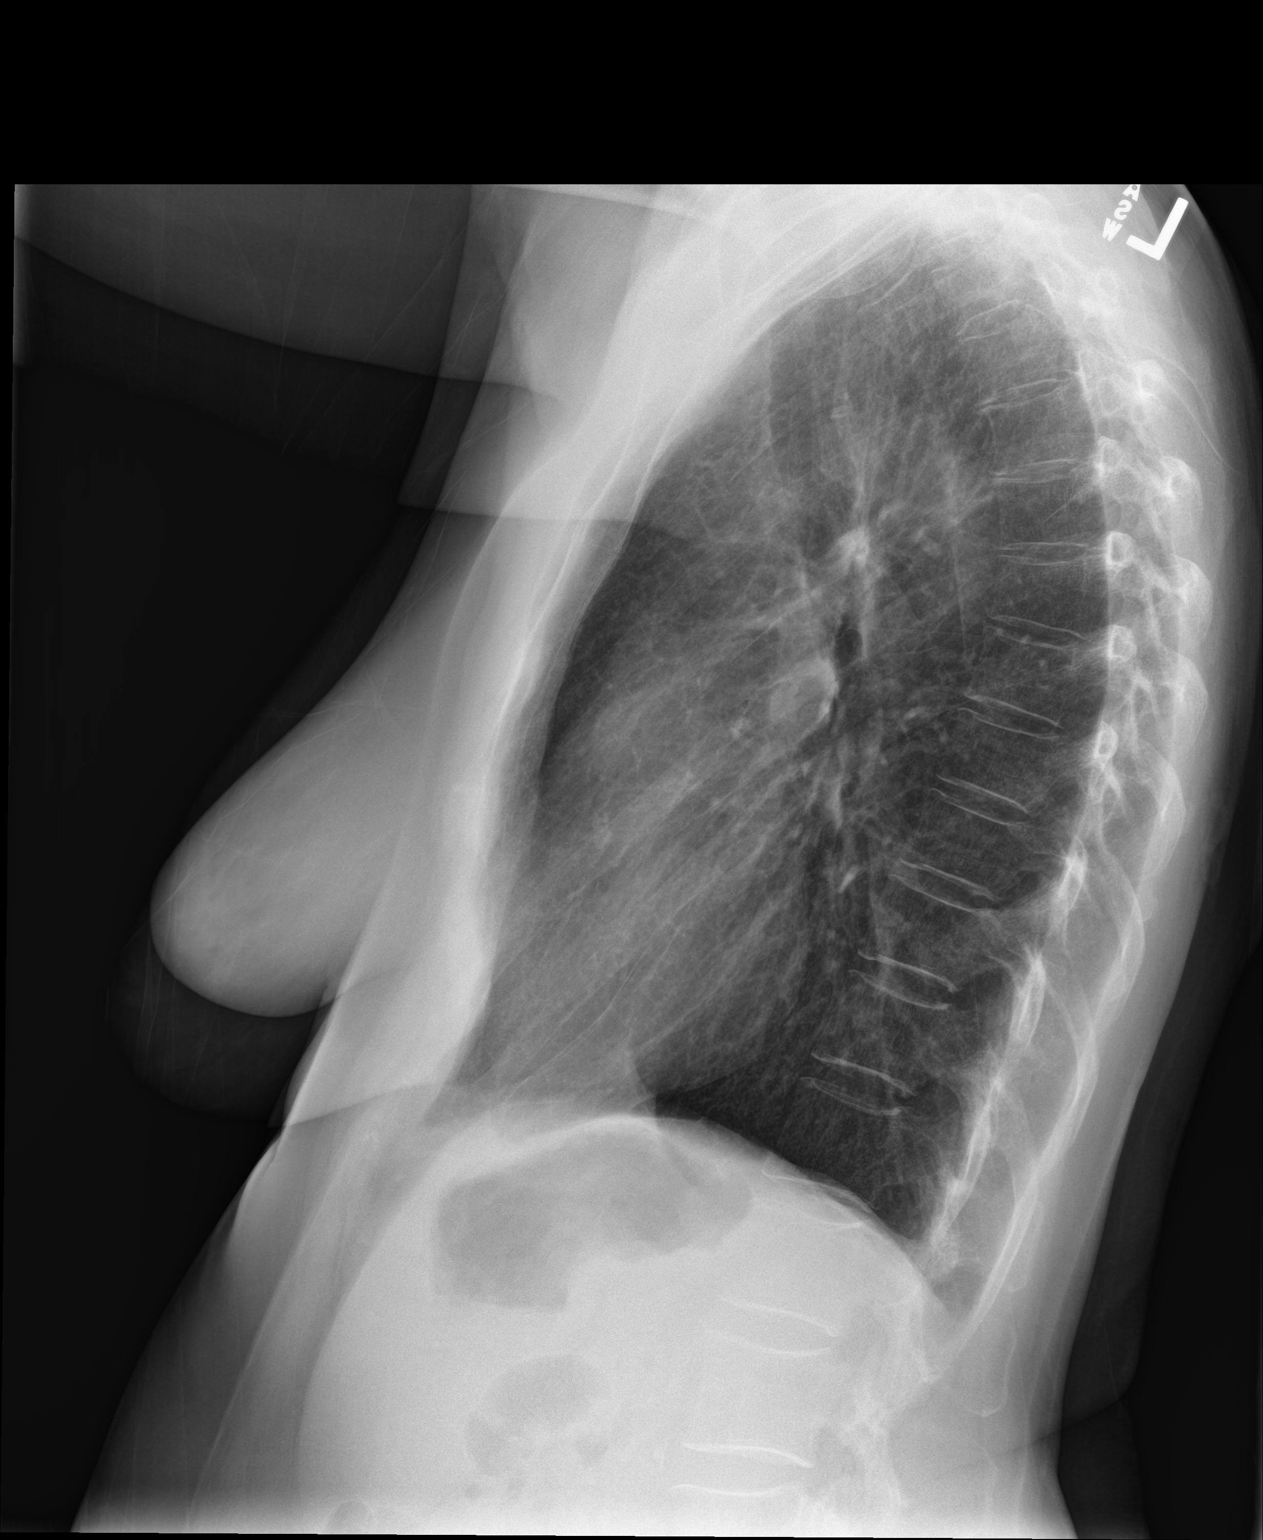

[chest pa (2 of 2)]
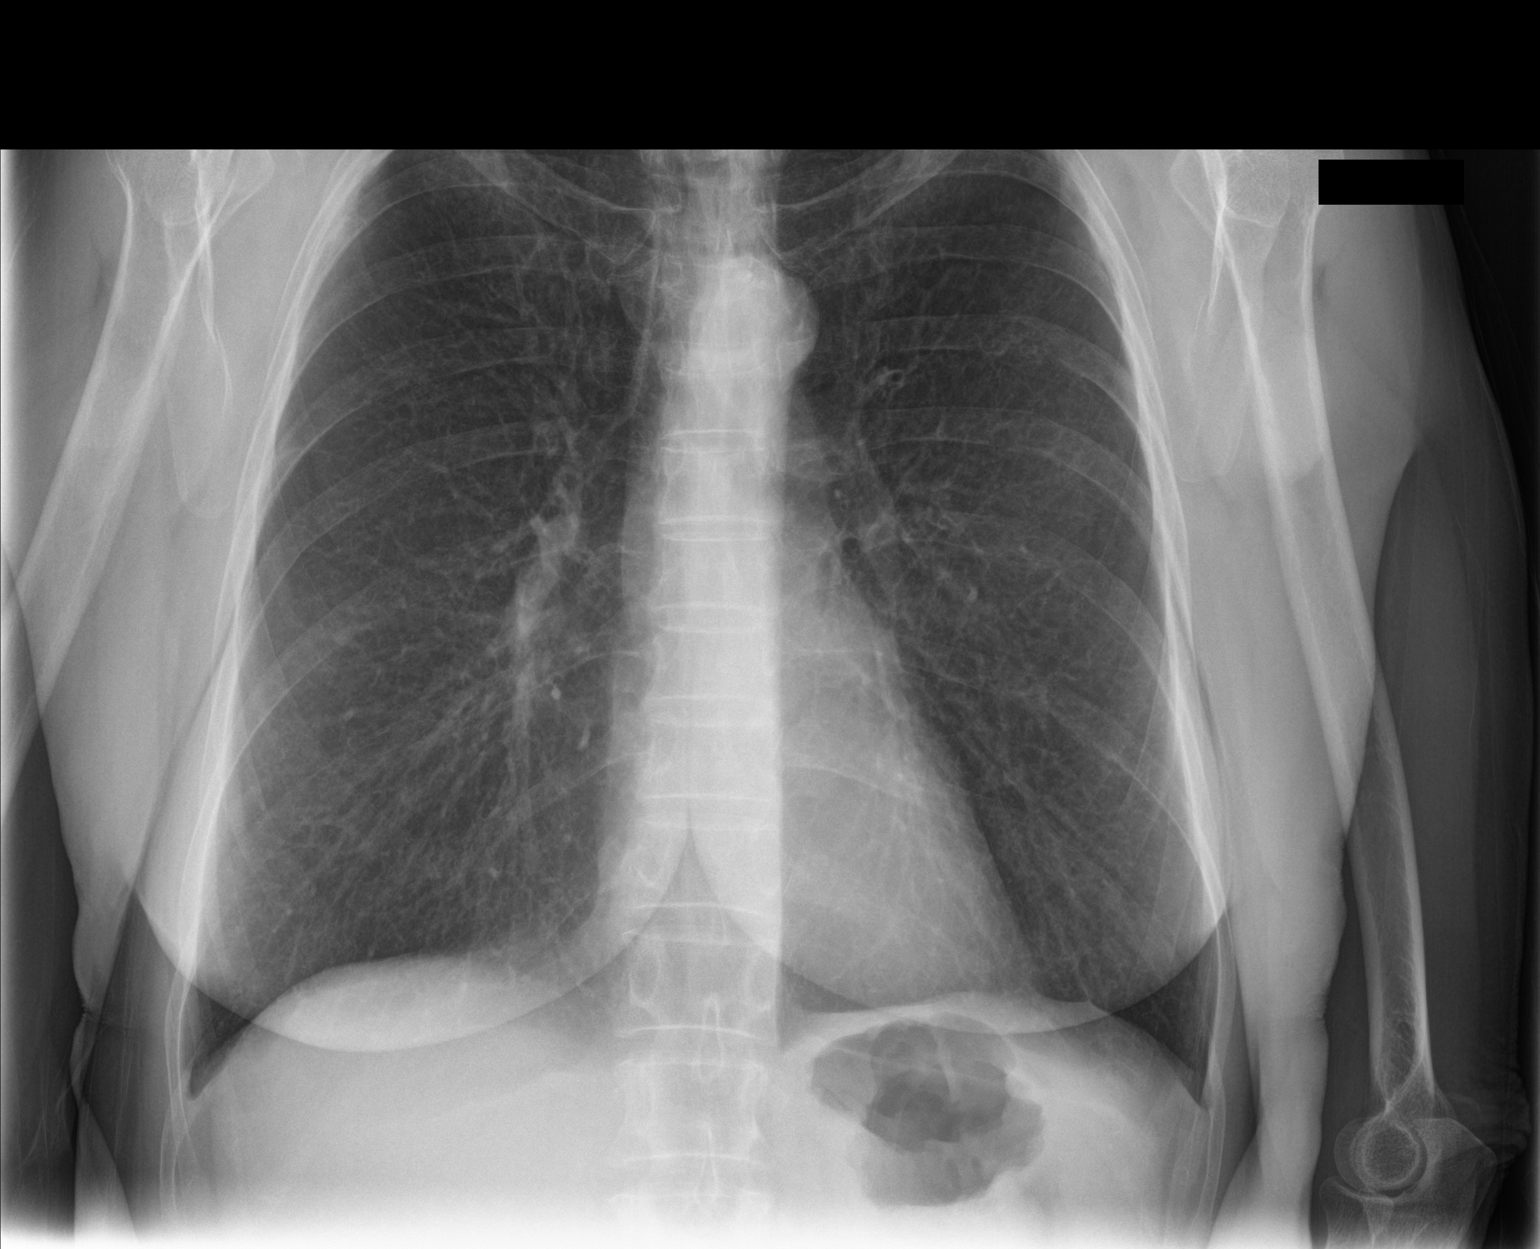

[3 of 3 positions shown; findings below may reference images not displayed]

FINDINGS: Cardiac size is within normal limits. Lung fields are clear of any
infiltrates or pulmonary edema. There is no pleural effusion or
pneumothorax. Pleural thickening is seen in both apices without
erosive changes in the adjacent ribs.
IMPRESSION: No active cardiopulmonary disease.
